# Patient Record
Sex: Male | Born: 1980 | Race: White | Hispanic: No | Marital: Single | Smoking: Current every day smoker
Health system: Southern US, Community
[De-identification: ages and names within clinical notes are randomized; demographics above are authoritative.]

---

## 2010-07-07 ENCOUNTER — Inpatient Hospital Stay: Payer: Self-pay | Admitting: Orthopedic Surgery

## 2011-02-27 ENCOUNTER — Ambulatory Visit: Payer: Self-pay | Admitting: Orthopedic Surgery

## 2011-12-17 IMAGING — CT CT OF THE LEFT ANKLE WITHOUT CONTRAST
1 series · 12 of 14 positions shown, 15 images · non-contrast
Comparison: none

REASON FOR EXAM: evaluate posterior malleolus fracture of tibia
COMMENTS:

[Series 8: axial · axial · 0.43mm/px · z∈[-306,-90]mm · 12 of 128 slices shown, 15 images]
[im 10/128  soft-tissue]
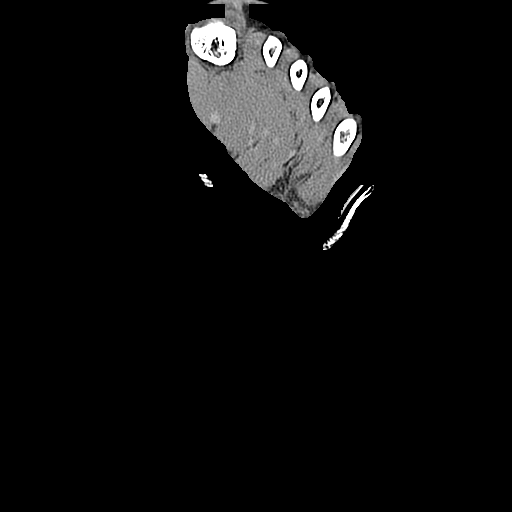
[im 10/128  bone]
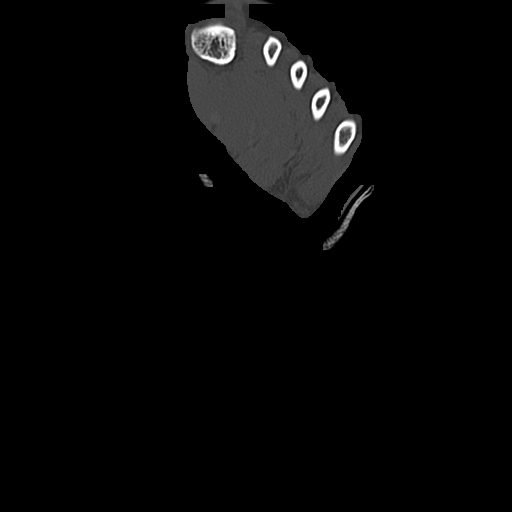
[im 20/128  bone]
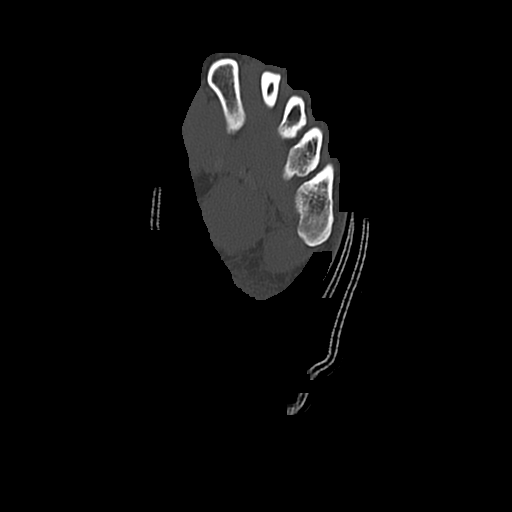
[im 30/128  bone]
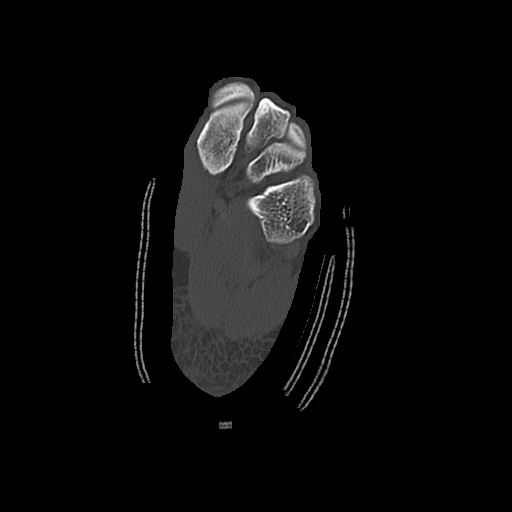
[im 40/128  bone]
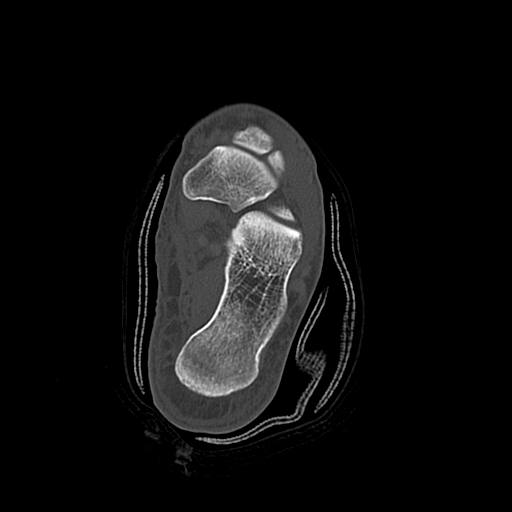
[im 49/128  soft-tissue]
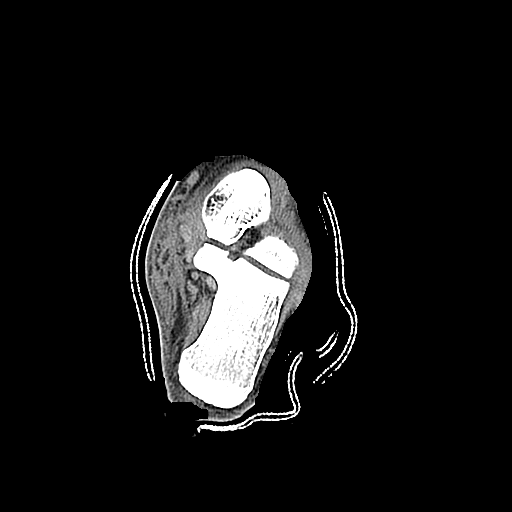
[im 49/128  bone]
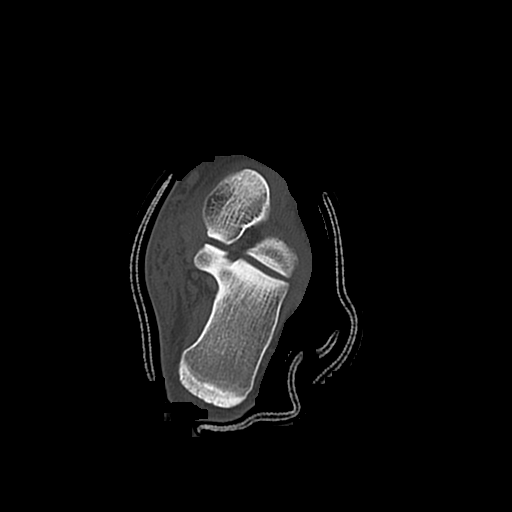
[im 59/128  bone]
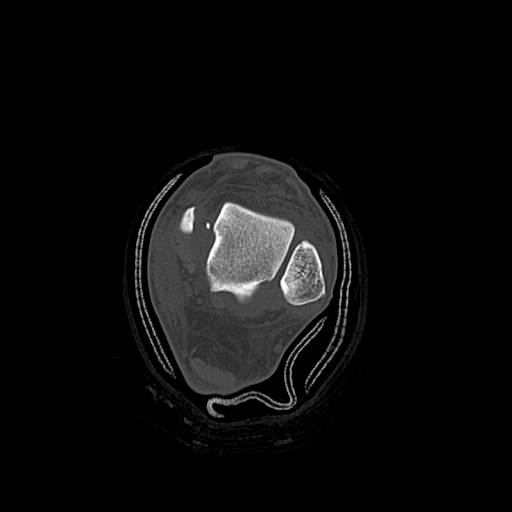
[im 69/128  bone]
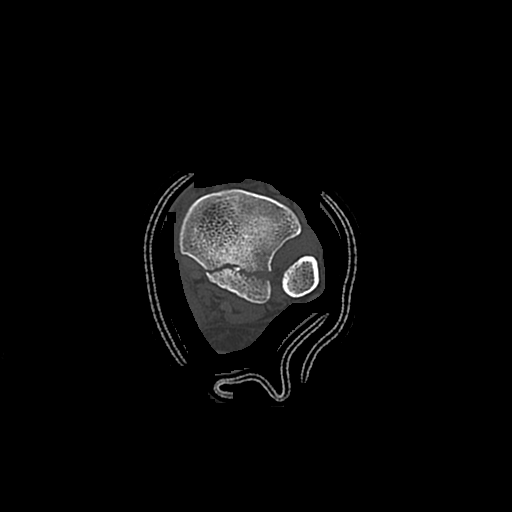
[im 79/128  bone]
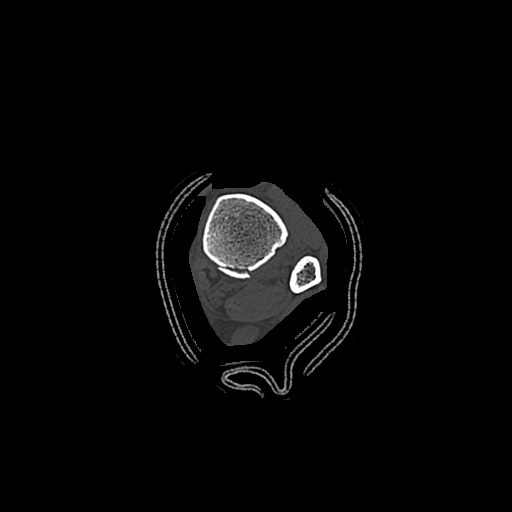
[im 88/128  soft-tissue]
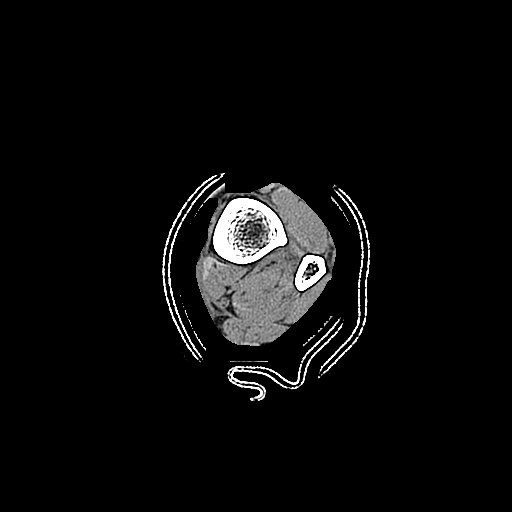
[im 88/128  bone]
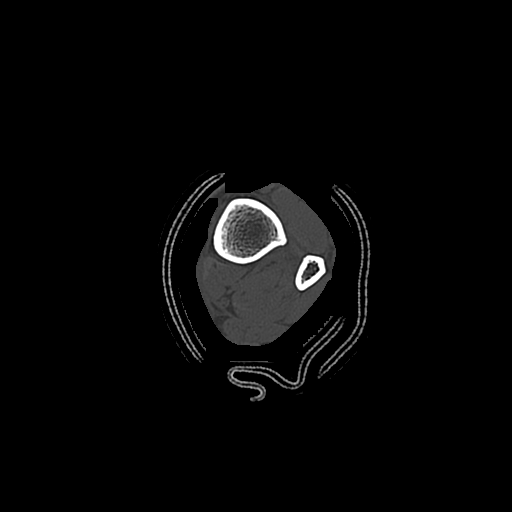
[im 98/128  bone]
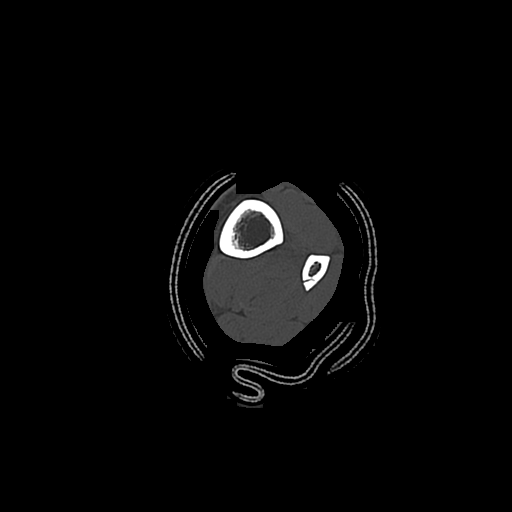
[im 108/128  bone]
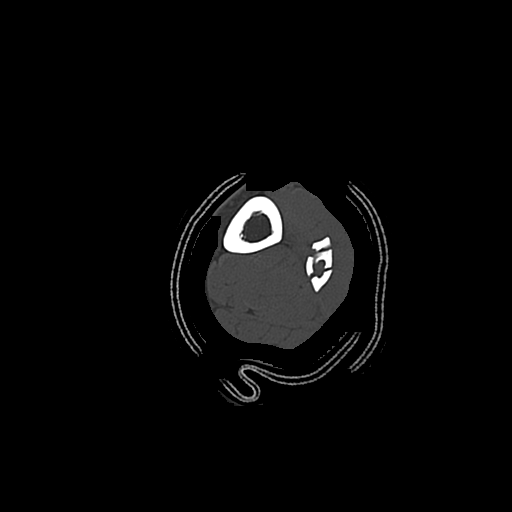
[im 118/128  bone]
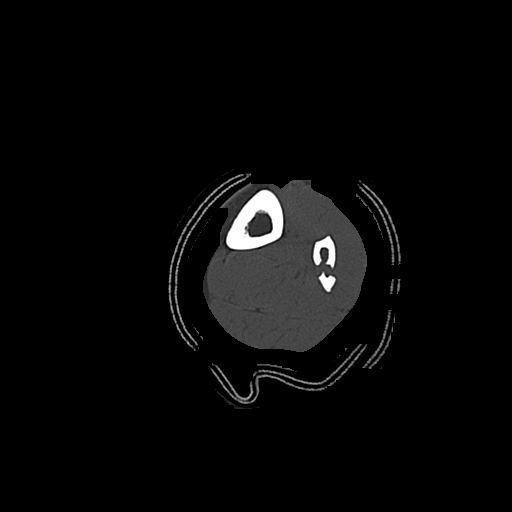

[12 of 14 positions shown; findings below may reference images not displayed]

PROCEDURE:     CT  - CT ANKLE LEFT WITHOUT CONTRAST  - July 07, 2010 [DATE]

RESULT:     Multislice helical acquisition through the left ankle is
reconstructed at bone window settings in the axial, coronal and sagittal
planes. There is no previous CT for comparison.

There is evidence of comminuted mildly displaced fracture in the junction of
the middle and distal thirds of the left fibula. There is a vertically
oriented mildly displaced fracture through the distal tibia with a small
fragment from the posterior malleolus of approximately 3.5 cm length. The
fracture line does extend to the talotibial joint. There is a tiny bony
fragment consistent with an avulsion from the medial malleolus. Lateral
displacement of the talus relative to the tibia is present. The talus itself
appears intact. There is no intra-articular bony fragment.
IMPRESSION: Fractures and dislocation of the left ankle as described.

## 2011-12-18 IMAGING — CR DG ANKLE 2V *L*
1 series · 3 of 3 positions shown · non-contrast
Comparison: none

REASON FOR EXAM: s/p ORIF of ankle
COMMENTS:   LMP: N/A

PROCEDURE:     DXR - DXR ANKLE LEFT AP AND LATERAL  - July 08, 2010 [DATE]
RESULT:     Images of the left ankle demonstrate metallic screws and plates
present with reduction and fixation casting material present. Anatomic
alignment appears present.

[Series 1: view not recorded · 0.17mm/px · 3 of 3 slices shown]
[im 1/3]
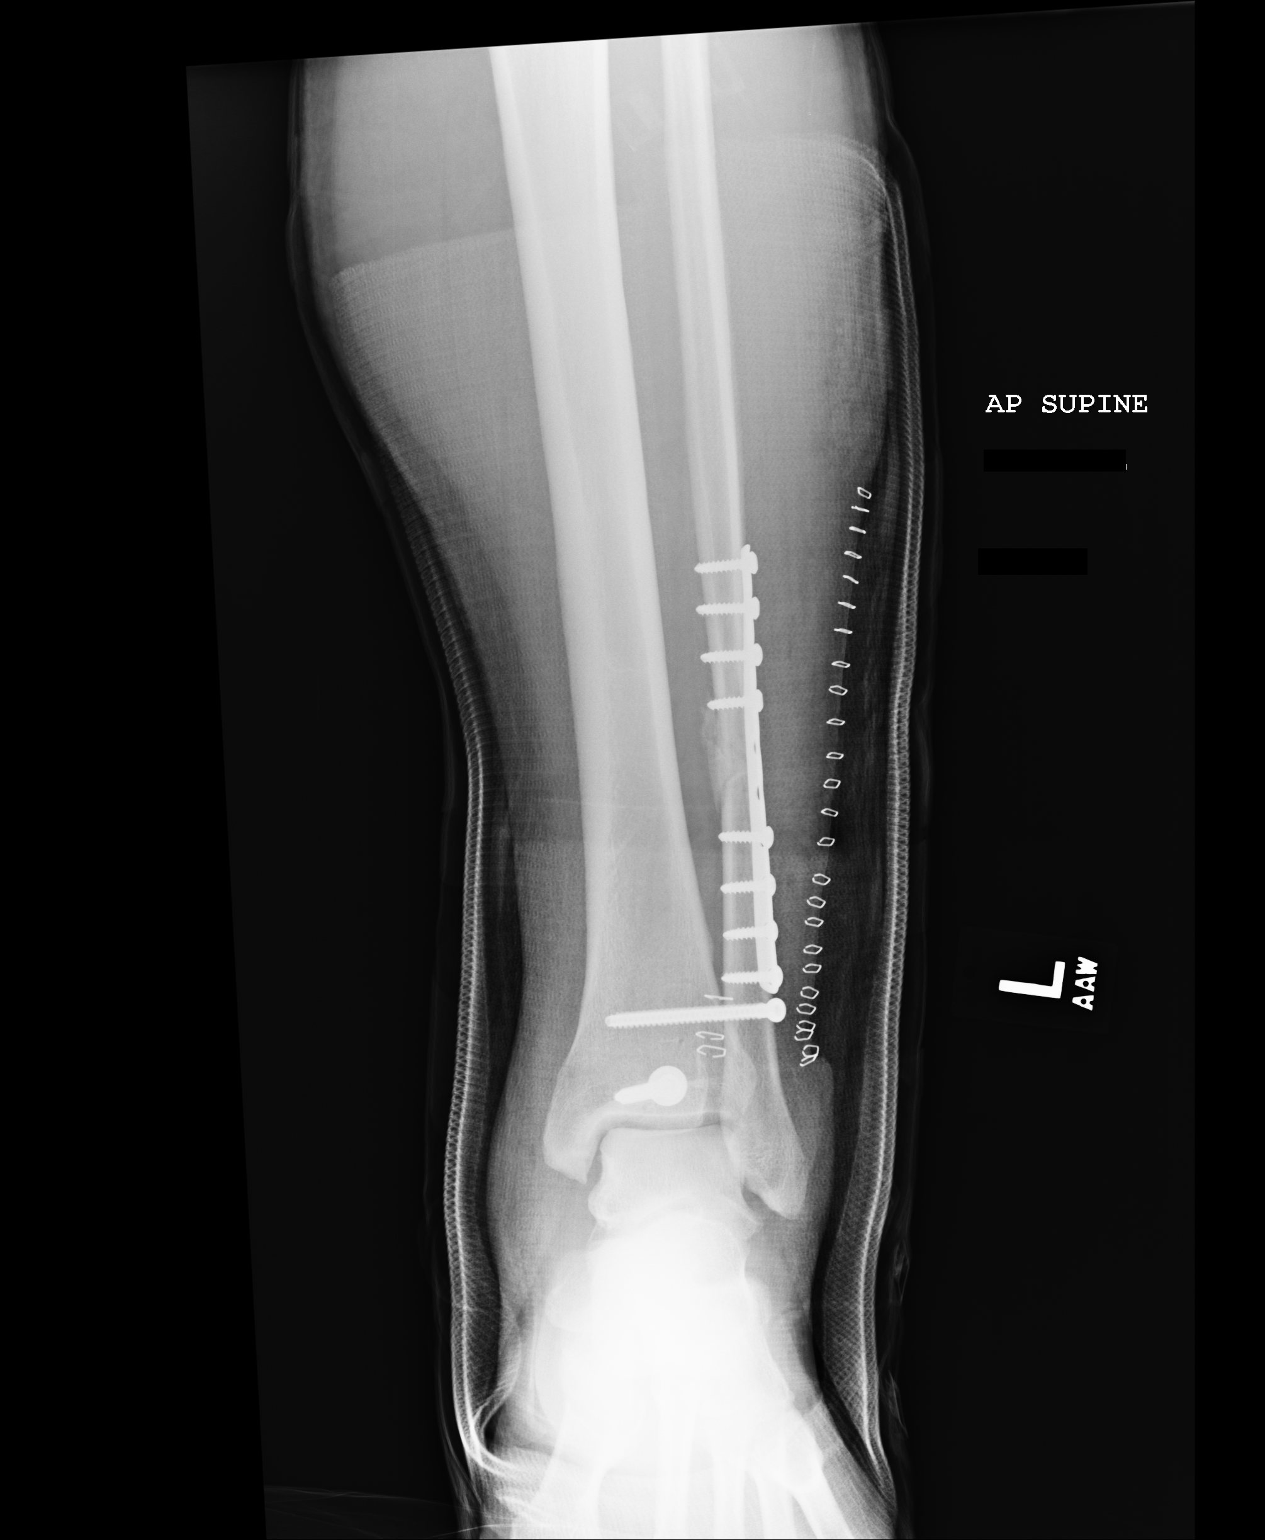
[im 2/3]
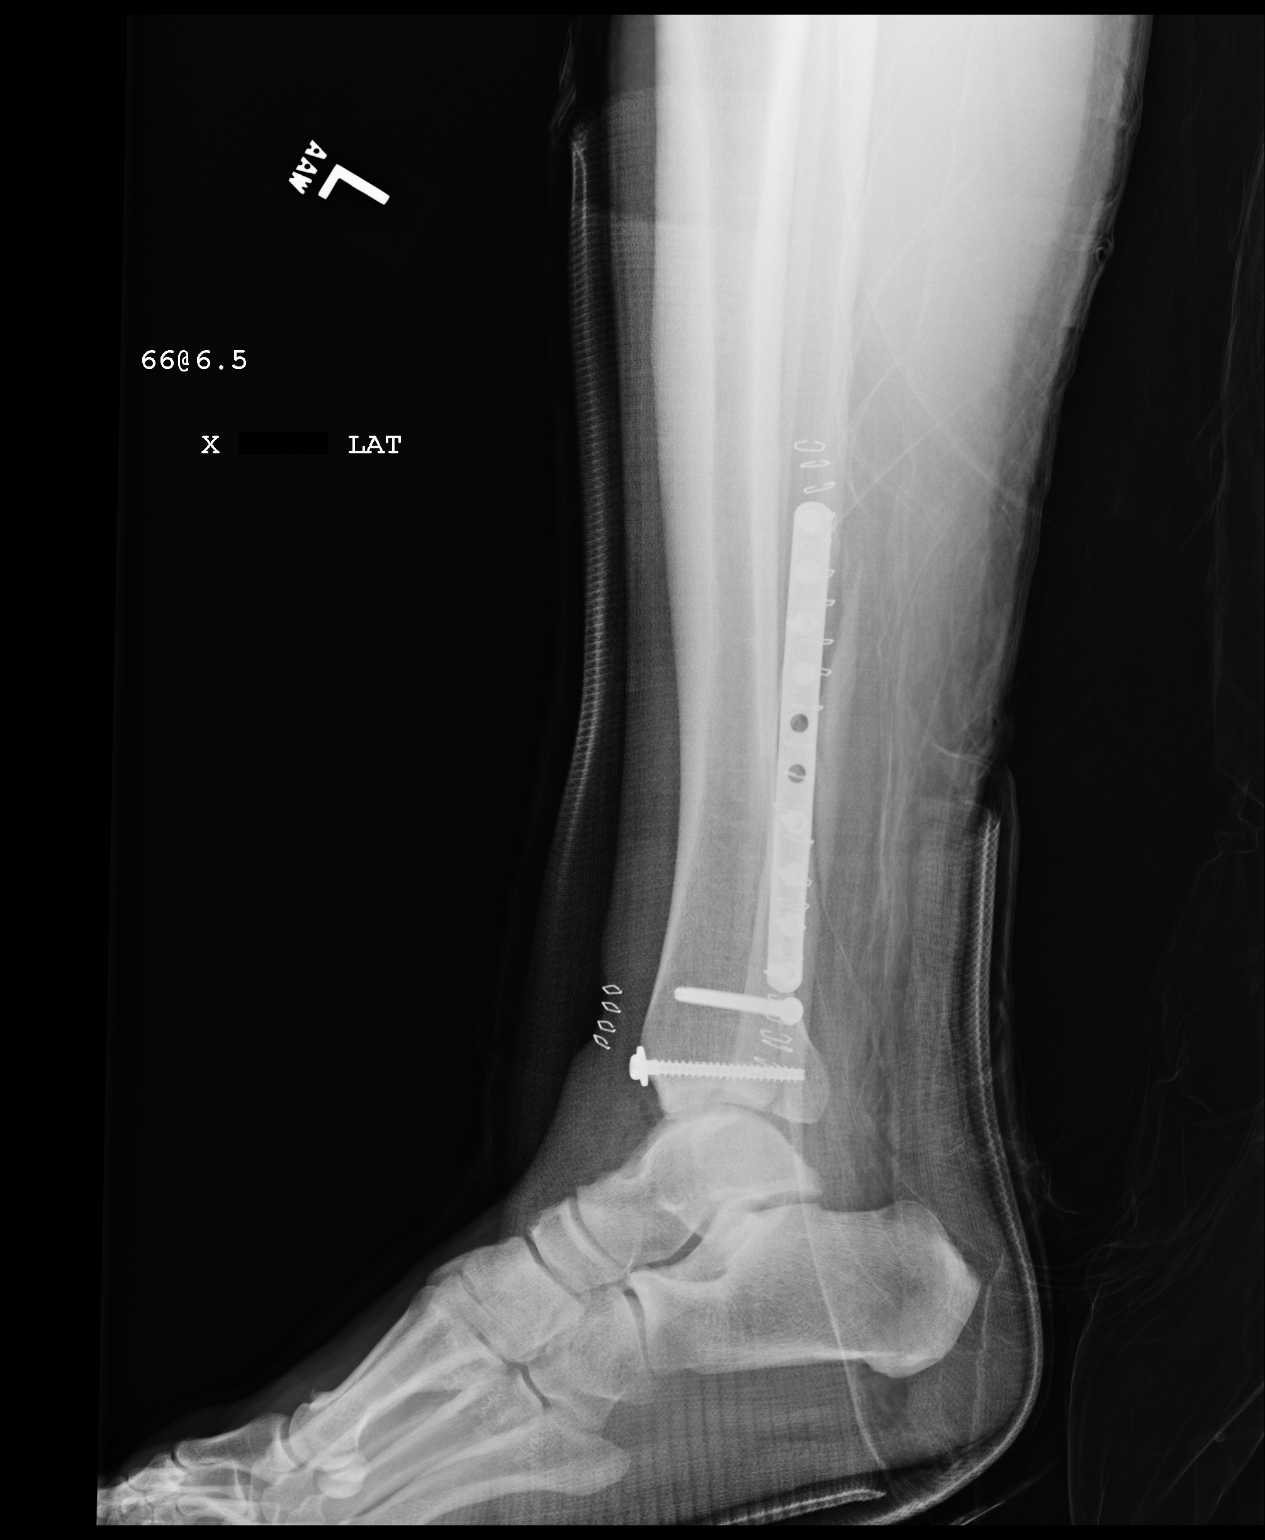
[im 3/3]
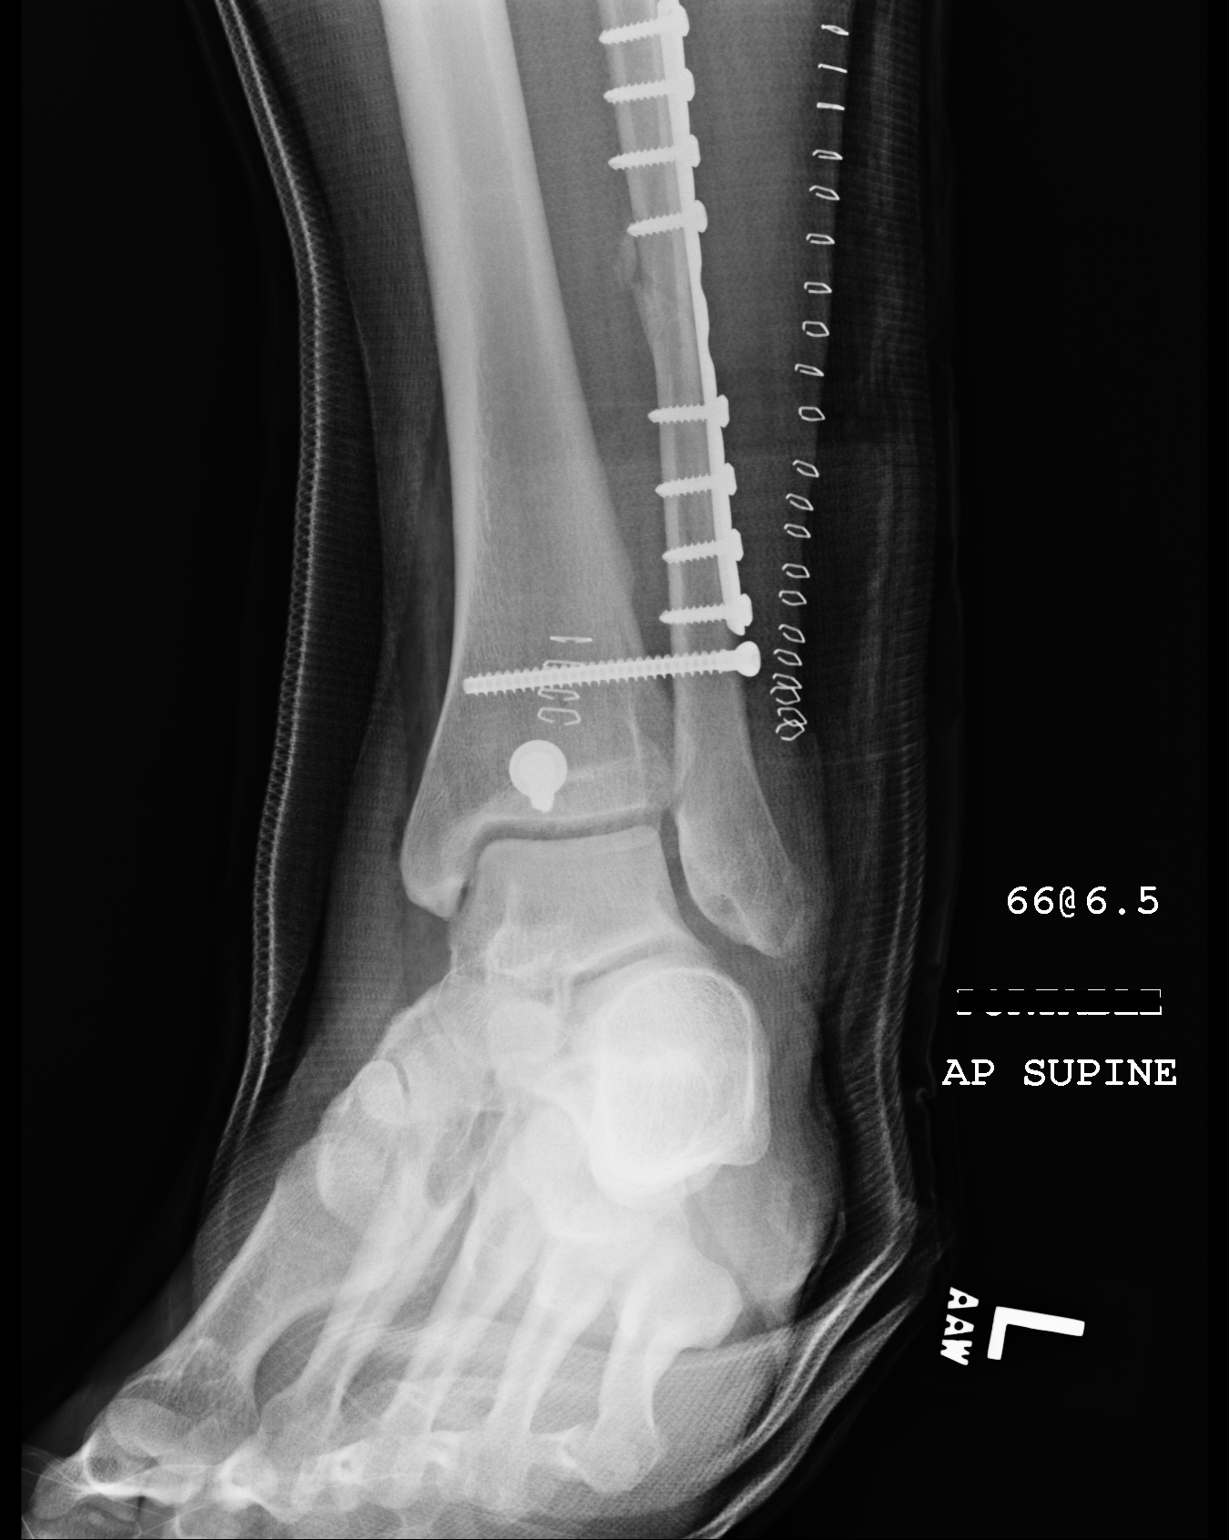

[3 of 3 positions shown; findings below may reference images not displayed]

IMPRESSION: Please see above.

## 2022-08-26 ENCOUNTER — Ambulatory Visit (INDEPENDENT_AMBULATORY_CARE_PROVIDER_SITE_OTHER): Payer: BC Managed Care – PPO | Admitting: Urology

## 2022-08-26 ENCOUNTER — Encounter: Payer: Self-pay | Admitting: Urology

## 2022-08-26 VITALS — BP 150/84 | HR 51 | Wt 221.0 lb

## 2022-08-26 DIAGNOSIS — Z3009 Encounter for other general counseling and advice on contraception: Secondary | ICD-10-CM | POA: Diagnosis not present

## 2022-08-26 NOTE — Progress Notes (Unsigned)
08/26/2022 8:13 AM   Danny Mckenzie 06-11-1981 [id number]  Referring provider: Olena Leatherwood, FNP [address] [place],  [place] [address]  Chief Complaint  Patient presents with   VAS Consult    HPI: 41 y.o. year old male referred for further evaluation of possible vasectomy.  He denies a history of testicular trauma or pain.  No urinary issues.  No previous scrotal surgeries.  He has 4 biological children, he and his wife desire no further pregnancies.   PMH: History reviewed. No pertinent past medical history.  Surgical History: History reviewed. No pertinent surgical history.  Home Medications:  Allergies as of 08/26/2022   No Known Allergies      Medication List        Accurate as of August 26, 2022 11:59 PM. If you have any questions, ask your nurse or doctor.          diazepam 10 MG tablet Commonly known as: Valium Take 1 tablet (10 mg total) by mouth once for 1 dose. Started by: Vanna Scotland, MD        Allergies: No Known Allergies  Family History: History reviewed. No pertinent family history.  Social History:  reports that he has been smoking cigarettes. He has never used smokeless tobacco. No history on file for alcohol use and drug use.   Physical Exam: BP (!) 150/84   Pulse (!) 51   Wt 221 lb (100.2 kg)   Constitutional:  Alert and oriented, No acute distress. HEENT: [place] AT, moist mucus membranes.  Trachea midline, no masses. Cardiovascular: No clubbing, cyanosis, or edema. Respiratory: Normal respiratory effort, no increased work of breathing. GI: Abdomen is soft, nontender, nondistended, no abdominal masses GU: Normal phallus.  Bilateral descended testicles without masses.  Vasa easily palpable bilaterally. Skin: No rashes, bruises or suspicious lesions. Neurologic: Grossly intact, no focal deficits, moving all 4 extremities. Psychiatric: Normal mood and affect.   Assessment & Plan:    1. Vasectomy  evaluation Today, we discussed what the vas deferens is, where it is located, and its function. We reviewed the procedure for vasectomy, it's risks, benefits, alternatives, and likelihood of achieving his goals. We discussed in detail the procedure, complications, and recovery as well as the need for clearance prior to unprotected intercourse. We discussed that vasectomy does not protect against sexually transmitted diseases. We discussed that this procedure does not result in immediate sterility and that they would need to use other forms of birth control until he has been cleared with negative postvasectomy semen analyses. I explained that the procedure is considered to be permanent and that attempts at reversal have varying degrees of success. These options include vasectomy reversal, sperm retrieval, and in vitro fertilization; these can be very expensive. We discussed the chance of postvasectomy pain syndrome which occurs in less than 5% of patients. I explained to the patient that there is no treatment to resolve this chronic pain, and that if it developed I would not be able to help resolve the issue, but that surgery is generally not needed for correction. I explained there have even been reports of systemic like illness associated with this chronic pain, and that there was no good cure. I explained that vasectomy it is not a 100% reliable form of birth control, and the risk of pregnancy after vasectomy is approximately 1 in 2000 men who had a negative postvasectomy semen analysis or rare non-motile sperm. I explained that repeat vasectomy was necessary in less  than 1% of vasectomy procedures when employing the type of technique that I use. I explained that he should refrain from ejaculation for approximately one week following vasectomy. I explained that there are other options for birth control which are permanent and non-permanent; we discussed these. I explained the rates of surgical complications, such  as symptomatic hematoma or infection, are low (1-2%) and vary with the surgeon's experience and criteria used to diagnose the complication.   The patient had the opportunity to ask questions to his stated satisfaction. He voiced understanding of the above factors and stated that he has read all the information provided to him and the packets and informed consent.  He is interested in receiving of Valium 10 mg prior to the procedure for the purpose of anxiolysis.  A prescription was given today.  He will have a driver on the day of the procedure.   Return for nov 3 at 1115 in [place].  Hollice Espy, MD  [facility] [address] [place], [place] [address] [phone]

## 2022-08-26 NOTE — Patient Instructions (Signed)

## 2022-08-27 ENCOUNTER — Encounter: Payer: Self-pay | Admitting: Urology

## 2022-08-27 MED ORDER — DIAZEPAM 10 MG PO TABS: 10.0000 mg | ORAL_TABLET | Freq: Once | ORAL | 0 refills | Status: AC

## 2022-09-05 ENCOUNTER — Ambulatory Visit (INDEPENDENT_AMBULATORY_CARE_PROVIDER_SITE_OTHER): Payer: BC Managed Care – PPO | Admitting: Urology

## 2022-09-05 DIAGNOSIS — Z9852 Vasectomy status: Secondary | ICD-10-CM

## 2022-09-05 DIAGNOSIS — Z302 Encounter for sterilization: Secondary | ICD-10-CM | POA: Diagnosis not present

## 2022-09-05 NOTE — Progress Notes (Signed)
09/05/22  CC:vas   HPI: 41 year old male who presents today for vasectomy.  There were no vitals taken for this visit. NED. A&Ox3.   No respiratory distress   Abd soft, NT, ND Normal external genitalia with patent urethral meatus  A timeout was performed.  Patient's identity and consent was confirmed.  All questions were answered.   Bilateral Vasectomy Procedure  Pre-Procedure: - Patient's scrotum was prepped and draped for vasectomy. - The vas was palpated through the scrotal skin on the left. - 1% Xylocaine was injected into the skin and surrounding tissue for placement  - In a similar manner, the vas on the right was identified, anesthetized, and stabilized.  Procedure: - A #11 blade was used to make a small stab incision in the skin overlying the vas - The left vas was isolated and brought up through the incision exposing that structure. - Bleeding points were cauterized as they occurred. - The vas was free from the surrounding structures and brought to the view. - A segment was positioned for placement with a hemostat. - A second hemostat was placed and a small segment between the two hemostats and was removed for inspection. - Each end of the transected vas lumen was fulgurated/ obliterated using needlepoint electrocautery -A fascial interposition was performed on testicular end of the vas using #3-0 chromic suture -The same procedure was performed on the right. - A single suture of #3-0 chromic catgut was used to close each lateral scrotal skin incision - A dressing was applied.  Post-Procedure: - Patient was instructed in care of the operative area - A specimen is to be delivered in 12 weeks   -Another form of contraception is to be used until post vasectomy semen analysis  Hollice Espy, MD

## 2022-09-05 NOTE — Patient Instructions (Signed)

## 2022-12-05 NOTE — Addendum Note (Signed)
Addended by: Kris Mouton on: 12/05/2022 04:32 PM   Modules accepted: Orders

## 2022-12-08 ENCOUNTER — Other Ambulatory Visit: Payer: BC Managed Care – PPO

## 2022-12-08 DIAGNOSIS — Z9852 Vasectomy status: Secondary | ICD-10-CM

## 2022-12-10 LAB — POST-VAS SPERM EVALUATION,QUAL: Volume: 5 mL

## 2022-12-15 ENCOUNTER — Other Ambulatory Visit: Payer: Self-pay

## 2022-12-15 DIAGNOSIS — Z9852 Vasectomy status: Secondary | ICD-10-CM

## 2023-01-12 ENCOUNTER — Other Ambulatory Visit: Payer: BC Managed Care – PPO

## 2023-01-19 ENCOUNTER — Other Ambulatory Visit: Payer: BC Managed Care – PPO
